# Patient Record
Sex: Male | Born: 2005 | Race: Black or African American | Hispanic: No | Marital: Single | Smoking: Never smoker
Health system: Southern US, Community
[De-identification: ages and names within clinical notes are randomized; demographics above are authoritative.]

## PROBLEM LIST (undated history)

## (undated) DIAGNOSIS — J45909 Unspecified asthma, uncomplicated: Secondary | ICD-10-CM

## (undated) DIAGNOSIS — T7840XA Allergy, unspecified, initial encounter: Secondary | ICD-10-CM

## (undated) HISTORY — DX: Allergy, unspecified, initial encounter: T78.40XA

## (undated) HISTORY — DX: Unspecified asthma, uncomplicated: J45.909

## (undated) HISTORY — PX: SHOULDER ARTHROSCOPY WITH OPEN ROTATOR CUFF REPAIR: SHX6092

---
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
## 2024-02-22 ENCOUNTER — Ambulatory Visit (HOSPITAL_COMMUNITY): Payer: Self-pay

## 2024-02-23 ENCOUNTER — Ambulatory Visit
Admission: EM | Admit: 2024-02-23 | Discharge: 2024-02-23 | Disposition: A | Discharge status: Home / Self Care | Payer: Self-pay

## 2024-02-23 ENCOUNTER — Encounter: Payer: Self-pay | Admitting: Emergency Medicine

## 2024-02-23 DIAGNOSIS — Z025 Encounter for examination for participation in sport: Secondary | ICD-10-CM

## 2024-02-23 HISTORY — DX: Unspecified asthma, uncomplicated: J45.909

## 2024-02-23 NOTE — ED Triage Notes (Signed)
Patient here for sports physical

## 2024-02-23 NOTE — ED Provider Notes (Signed)
 SUBJECTIVE:  George Palmer is a 18 y.o. male presenting for well adolescent and school/sports physical. He is seen today alone.  PMH: No asthma, diabetes, heart disease, epilepsy or orthopedic problems in the past.  ROS: no wheezing, cough or dyspnea, no chest pain, no abdominal pain, no headaches, no bowel or bladder symptoms, no pain or lumps in groin or testes, no breast pain or lumps. No problems during sports participation in the past.  Social History: Denies the use of tobacco, alcohol or street drugs. Sexual history: not sexually active Parental concerns: none  OBJECTIVE:  General appearance: WDWN male. ENT: ears and throat normal Eyes: Vision : 20/20 without correction PERRLA, fundi normal. Neck: supple, thyroid normal, no adenopathy Lungs:  clear, no wheezing or rales Heart: no murmur, regular rate and rhythm, normal S1 and S2 Abdomen: no masses palpated, no organomegaly or tenderness Genitalia: genitalia not examined Spine: normal, no scoliosis Skin: Normal with mild acne noted. Neuro: normal Extremities: normal  ASSESSMENT:  Well adolescent male  PLAN:  Counseling: nutrition, safety, smoking, alcohol, drugs, puberty, peer interaction, sexual education, exercise, preconditioning for sports. Acne treatment discussed. Cleared for school and sports activities.    Aurea Goodell B, NP 02/23/24 1414

## 2024-02-26 ENCOUNTER — Encounter: Payer: Self-pay | Admitting: Emergency Medicine

## 2024-03-23 ENCOUNTER — Ambulatory Visit
Admission: EM | Admit: 2024-03-23 | Discharge: 2024-03-23 | Disposition: A | Discharge status: Home / Self Care | Attending: Emergency Medicine | Admitting: Emergency Medicine

## 2024-03-23 ENCOUNTER — Encounter: Payer: Self-pay | Admitting: Emergency Medicine

## 2024-03-23 DIAGNOSIS — Z202 Contact with and (suspected) exposure to infections with a predominantly sexual mode of transmission: Secondary | ICD-10-CM | POA: Diagnosis present

## 2024-03-23 DIAGNOSIS — R300 Dysuria: Secondary | ICD-10-CM | POA: Diagnosis present

## 2024-03-23 LAB — POCT URINE DIPSTICK
Bilirubin, UA: NEGATIVE
Glucose, UA: NEGATIVE mg/dL
Ketones, POC UA: NEGATIVE mg/dL
Leukocytes, UA: NEGATIVE
Nitrite, UA: NEGATIVE
POC PROTEIN,UA: NEGATIVE
Spec Grav, UA: >=1.03 — AB (ref 1.010–1.025)
Urobilinogen, UA: 0.2 U/dL
pH, UA: 6 (ref 5.0–8.0)

## 2024-03-23 MED ORDER — DOXYCYCLINE HYCLATE 100 MG PO CAPS: 100.0000 mg | ORAL_CAPSULE | Freq: Two times a day (BID) | ORAL | 0 refills | Status: AC

## 2024-03-23 NOTE — Discharge Instructions (Signed)
 Today you are being treated for chlamydia as you have a known exposure and are symptomatic  Take doxycycline  twice daily for 7 days, this medicine can make your skin sensitive to the sunlight, please be mindful of this  Please refrain from having any form of sexual intercourse until treatment is complete, your partner has completed treatment and all symptoms have gone away  Labs pending 2-3 days, you will be contacted if positive for any sti and treatment will be sent to the pharmacy, you will have to return to the clinic if positive for gonorrhea to receive treatment   Please refrain from having sex until labs results, if positive please refrain from having sex until treatment complete and symptoms resolve   If positive for , Chlamydia  gonorrhea or trichomoniasis please notify partner or partners so they may tested as well  Moving forward, it is recommended you use some form of protection against the transmission of sti infections  such as condoms or dental dams with each sexual encounter

## 2024-03-23 NOTE — ED Triage Notes (Signed)
 Patient reports burning while urinating x 5 days. Have taken anything for symptoms. Patient reports that his partner tested positive for Chlamydia Thursday.

## 2024-03-23 NOTE — ED Provider Notes (Signed)
 George Palmer    CSN: [id number] Arrival date & time: 03/23/24  1452      History   Chief Complaint Chief Complaint  Patient presents with   SEXUALLY TRANSMITTED DISEASE    HPI George Palmer is a 18 y.o. male.   Patient presents for evaluation of urinary frequency and dysuria beginning 4 days ago.  Has not attempted treatment.  Known exposure to chlamydia.  Denies penile discharge, penile or testicle swelling, hematuria abdominal or flank pain.  Past Medical History:  Diagnosis Date   Allergy    Asthma     There are no active problems to display for this patient.   Past Surgical History:  Procedure Laterality Date   SHOULDER ARTHROSCOPY WITH OPEN ROTATOR CUFF REPAIR Right        Home Medications    Prior to Admission medications   Medication Sig Start Date End Date Taking? Authorizing Provider  doxycycline  (VIBRAMYCIN ) 100 MG capsule Take 1 capsule (100 mg total) by mouth 2 (two) times daily. 03/23/24  Yes Mivaan Corbitt R, NP  albuterol (VENTOLIN HFA) 108 (90 Base) MCG/ACT inhaler Inhale 2 puffs into the lungs every 4 (four) hours as needed. for wheezing 05/15/18   [provider]    Family History Family History  Problem Relation Age of Onset   Hypertension Maternal Grandfather    Hyperlipidemia Maternal Grandfather    Asthma Paternal Grandmother     Social History Social History   Tobacco Use   Smoking status: Never   Smokeless tobacco: Never  Vaping Use   Vaping status: Never Used  Substance Use Topics   Alcohol use: Never   Drug use: Never     Allergies   Patient has no known allergies.   Review of Systems Review of Systems   Physical Exam Triage Vital Signs ED Triage Vitals  Encounter Vitals Group     BP 03/23/24 1519 123/80     Girls Systolic BP Percentile --      Girls Diastolic BP Percentile --      Boys Systolic BP Percentile --      Boys Diastolic BP Percentile --      Pulse Rate 03/23/24 1519 80     Resp  03/23/24 1519 18     Temp 03/23/24 1519 98.7 F (37.1 C)     Temp Source 03/23/24 1519 Oral     SpO2 03/23/24 1517 100 %     Weight --      Height --      Head Circumference --      Peak Flow --      Pain Score 03/23/24 1516 0     Pain Loc --      Pain Education --      Exclude from Growth Chart --    No data found.  Updated Vital Signs BP 123/80 (BP Location: Left Arm)   Pulse 80   Temp 98.7 F (37.1 C) (Oral)   Resp 18   SpO2 100%   Visual Acuity Right Eye Distance:   Left Eye Distance:   Bilateral Distance:    Right Eye Near:   Left Eye Near:    Bilateral Near:     Physical Exam Constitutional:      Appearance: Normal appearance.  Eyes:     Extraocular Movements: Extraocular movements intact.  Pulmonary:     Effort: Pulmonary effort is normal.  Genitourinary:    Comments: deferred Neurological:     Mental  Status: He is alert and oriented to person, place, and time.      UC Treatments / Results  Labs (all labs ordered are listed, but only abnormal results are displayed) Labs Reviewed  POCT URINE DIPSTICK - Abnormal; Notable for the following components:      Result Value   Spec Grav, UA >=1.030 (*)    Blood, UA trace-intact (*)    All other components within normal limits  CYTOLOGY, (ORAL, ANAL, URETHRAL) ANCILLARY ONLY    EKG   Radiology No results found.  Procedures Procedures (including critical care time)  Medications Ordered in UC Medications - No data to display  Initial Impression / Assessment and Plan / UC Course  I have reviewed the triage vital signs and the nursing notes.  Pertinent labs & imaging results that were available during my care of the patient were reviewed by me and considered in my medical decision making (see chart for details).  Dysuria, exposure to chlamydia  Urinalysis negative, clear thick discharge present within the urine sample, discussed this with patient, empirically placed on doxycycline  due to known  exposure and discussed sensitivity to sunlight as a possible adverse effect, declined HIV and syphilis testing, STI labs pending will treat per protocol, advised abstinence until lab results, and/or treatment is complete, advised condom use during all sexual encounters moving, may follow-up with urgent care as needed  Final Clinical Impressions(s) / UC Diagnoses   Final diagnoses:  Dysuria  Exposure to chlamydia     Discharge Instructions      Today you are being treated for chlamydia as you have a known exposure and are symptomatic  Take doxycycline  twice daily for 7 days, this medicine can make your skin sensitive to the sunlight, please be mindful of this  Please refrain from having any form of sexual intercourse until treatment is complete, your partner has completed treatment and all symptoms have gone away  Labs pending 2-3 days, you will be contacted if positive for any sti and treatment will be sent to the pharmacy, you will have to return to the clinic if positive for gonorrhea to receive treatment   Please refrain from having sex until labs results, if positive please refrain from having sex until treatment complete and symptoms resolve   If positive for , Chlamydia  gonorrhea or trichomoniasis please notify partner or partners so they may tested as well  Moving forward, it is recommended you use some form of protection against the transmission of sti infections  such as condoms or dental dams with each sexual encounter     ED Prescriptions     Medication Sig Dispense Auth. Provider   doxycycline  (VIBRAMYCIN ) 100 MG capsule Take 1 capsule (100 mg total) by mouth 2 (two) times daily. 14 capsule Arvetta Araque, Shelba SAUNDERS, NP      PDMP not reviewed this encounter.   Teresa Shelba SAUNDERS, [place] 03/23/24 [phone]

## 2024-03-26 ENCOUNTER — Ambulatory Visit (HOSPITAL_COMMUNITY): Payer: Self-pay

## 2024-03-26 LAB — CYTOLOGY, (ORAL, ANAL, URETHRAL) ANCILLARY ONLY
Chlamydia: POSITIVE — AB
Comment: NEGATIVE
Comment: NEGATIVE
Comment: NORMAL
Neisseria Gonorrhea: NEGATIVE
Trichomonas: NEGATIVE
